# Patient Record
Sex: Female | Born: 1970 | Race: White | Hispanic: No | Marital: Married | Smoking: Never smoker
Health system: Southern US, Community
[De-identification: ages and names within clinical notes are randomized; demographics above are authoritative.]

## PROBLEM LIST (undated history)

## (undated) DIAGNOSIS — F32A Depression, unspecified: Secondary | ICD-10-CM

## (undated) DIAGNOSIS — F329 Major depressive disorder, single episode, unspecified: Secondary | ICD-10-CM

## (undated) DIAGNOSIS — I10 Essential (primary) hypertension: Secondary | ICD-10-CM

## (undated) HISTORY — PX: CHOLECYSTECTOMY: SHX55

## (undated) HISTORY — PX: GALLBLADDER SURGERY: SHX652

---

## 2005-03-13 ENCOUNTER — Ambulatory Visit: Payer: Self-pay | Admitting: Unknown Physician Specialty

## 2008-01-16 ENCOUNTER — Emergency Department: Payer: Self-pay | Admitting: Emergency Medicine

## 2008-01-21 ENCOUNTER — Emergency Department: Payer: Self-pay | Admitting: Internal Medicine

## 2008-01-24 ENCOUNTER — Emergency Department: Payer: Self-pay | Admitting: Internal Medicine

## 2012-07-29 ENCOUNTER — Ambulatory Visit: Payer: Self-pay | Admitting: Unknown Physician Specialty

## 2014-05-15 ENCOUNTER — Ambulatory Visit: Payer: Self-pay

## 2015-10-23 ENCOUNTER — Ambulatory Visit
Admission: EM | Admit: 2015-10-23 | Discharge: 2015-10-23 | Disposition: A | Discharge status: Home / Self Care | Payer: PRIVATE HEALTH INSURANCE | Attending: Family Medicine | Admitting: Family Medicine

## 2015-10-23 DIAGNOSIS — N390 Urinary tract infection, site not specified: Secondary | ICD-10-CM

## 2015-10-23 HISTORY — DX: Depression, unspecified: F32.A

## 2015-10-23 HISTORY — DX: Major depressive disorder, single episode, unspecified: F32.9

## 2015-10-23 LAB — URINALYSIS COMPLETE WITH MICROSCOPIC (ARMC ONLY)
Bilirubin Urine: NEGATIVE
Glucose, UA: NEGATIVE mg/dL
Ketones, ur: NEGATIVE mg/dL
Nitrite: NEGATIVE
PROTEIN: NEGATIVE mg/dL
RBC / HPF: NONE SEEN RBC/hpf (ref 0–5)
SPECIFIC GRAVITY, URINE: 1.01 (ref 1.005–1.030)
pH: 7.5 (ref 5.0–8.0)

## 2015-10-23 MED ORDER — PHENAZOPYRIDINE HCL 200 MG PO TABS: 200.0000 mg | ORAL_TABLET | Freq: Three times a day (TID) | ORAL | Status: AC

## 2015-10-23 MED ORDER — CEPHALEXIN 500 MG PO CAPS: 500.0000 mg | ORAL_CAPSULE | Freq: Two times a day (BID) | ORAL | Status: DC

## 2015-10-23 NOTE — ED Provider Notes (Signed)
CSN: [id number]     Arrival date & time 10/23/15  0845 History   First MD Initiated Contact with Patient 10/23/15 0945     Chief Complaint  Patient presents with  . Urinary Frequency    Burning and frequency, pressure with urination and worsening. Tried AZO. Reports some blood in urine. Pain 5/10   (Consider location/radiation/quality/duration/timing/severity/associated sxs/prior Treatment) HPI  This 45 year old female who presents with burning and frequency with pressure with urination as well as  insatiety which has been worsening. She has had no fever and no chills. Denies any nausea or vomiting. She's been using over-the-counter preparations which have not been beneficial. She's had previous UTIs in the past mostly in childhood and her last one as an adult was several years ago. She denies any vaginal discharge. She has noticed a small amount of blood in her urine at times she has an IUD in place and states that occasionally she will spot   Past Medical History  Diagnosis Date  . Depression    Past Surgical History  Procedure Laterality Date  . Gallbladder surgery     Family History  Problem Relation Age of Onset  . CAD Mother   . Diabetes Mother   . Alcoholism Father    Social History  Substance Use Topics  . Smoking status: Never Smoker   . Smokeless tobacco: None  . Alcohol Use: Yes     Comment: social   OB History    No data available     Review of Systems  Constitutional: Positive for activity change. Negative for fever, chills and fatigue.  Genitourinary: Positive for dysuria, urgency, frequency, hematuria and difficulty urinating.  All other systems reviewed and are negative.   Allergies  Review of patient's allergies indicates no known allergies.  Home Medications   Prior to Admission medications   Medication Sig Start Date End Date Taking? Authorizing Provider  sertraline (ZOLOFT) 25 MG tablet Take 25 mg by mouth daily.   Yes Historical Provider, MD   cephALEXin (KEFLEX) 500 MG capsule Take 1 capsule (500 mg total) by mouth 2 (two) times daily. 10/23/15   Lutricia FeilWilliam P Duward Allbritton, PA-C  phenazopyridine (PYRIDIUM) 200 MG tablet Take 1 tablet (200 mg total) by mouth 3 (three) times daily. 10/23/15   Lutricia FeilWilliam P Jona Zappone, PA-C   Meds Ordered and Administered this Visit  Medications - No data to display  BP 144/79 mmHg  Pulse 74  Temp(Src) 97.5 F (36.4 C) (Oral)  Resp 18  Ht 5\' 4"  (1.626 m)  Wt 164 lb (74.39 kg)  BMI 28.14 kg/m2  SpO2 100% No data found.   Physical Exam  Constitutional: She is oriented to person, place, and time. She appears well-developed and well-nourished. No distress.  HENT:  Head: Normocephalic and atraumatic.  Eyes: Conjunctivae are normal. Pupils are equal, round, and reactive to light.  Neck: Normal range of motion. Neck supple.  Pulmonary/Chest: Effort normal and breath sounds normal. No respiratory distress. She has no wheezes. She has no rales.  Abdominal: Soft. Bowel sounds are normal. She exhibits no distension. There is no tenderness. There is no rebound and no guarding.  Musculoskeletal: Normal range of motion. She exhibits no edema or tenderness.  Neurological: She is alert and oriented to person, place, and time.  Skin: Skin is warm and dry. She is not diaphoretic.  Psychiatric: She has a normal mood and affect. Her behavior is normal. Judgment and thought content normal.  Nursing note and vitals reviewed.  ED Course  Procedures (including critical care time)  Labs Review Labs Reviewed  URINALYSIS COMPLETEWITH MICROSCOPIC ([facility] ONLY) - Abnormal; Notable for the following:    Color, Urine STRAW (*)    Hgb urine dipstick TRACE (*)    Leukocytes, UA TRACE (*)    Bacteria, UA FEW (*)    Squamous Epithelial / LPF 0-5 (*)    All other components within normal limits  URINE CULTURE    Imaging Review No results found.   Visual Acuity Review  Right Eye Distance:   Left Eye Distance:   Bilateral  Distance:    Right Eye Near:   Left Eye Near:    Bilateral Near:         MDM   1. UTI (lower urinary tract infection)    Discharge Medication List as of 10/23/2015 10:04 AM    START taking these medications   Details  cephALEXin (KEFLEX) 500 MG capsule Take 1 capsule (500 mg total) by mouth 2 (two) times daily., Starting 10/23/2015, Until Discontinued, Normal    phenazopyridine (PYRIDIUM) 200 MG tablet Take 1 tablet (200 mg total) by mouth 3 (three) times daily., Starting 10/23/2015, Until Discontinued, Normal      Plan: 1. Test/x-ray results and diagnosis reviewed with patient 2. rx as per orders; risks, benefits, potential side effects reviewed with patient 3. Recommend supportive treatment with Increasing fluids. She'll call in 48 hours for results of CNS.  Lutricia Feil, PA-C 10/23/15 1018

## 2015-10-23 NOTE — Discharge Instructions (Signed)
Dysuria °Dysuria is pain or discomfort while urinating. The pain or discomfort may be felt in the tube that carries urine out of the bladder (urethra) or in the surrounding tissue of the genitals. The pain may also be felt in the groin area, lower abdomen, and lower back. You may have to urinate frequently or have the sudden feeling that you have to urinate (urgency). Dysuria can affect both men and women, but is more common in women. °Dysuria can be caused by many different things, including: °· Urinary tract infection in women. °· Infection of the kidney or bladder. °· Kidney stones or bladder stones. °· Certain sexually transmitted infections (STIs), such as chlamydia. °· Dehydration. °· Inflammation of the vagina. °· Use of certain medicines. °· Use of certain soaps or scented products that cause irritation. °HOME CARE INSTRUCTIONS °Watch your dysuria for any changes. The following actions may help to reduce any discomfort you are feeling: °· Drink enough fluid to keep your urine clear or pale yellow. °· Empty your bladder often. Avoid holding urine for long periods of time. °· After a bowel movement or urination, women should cleanse from front to back, using each tissue only once. °· Empty your bladder after sexual intercourse. °· Take medicines only as directed by your health care provider. °· If you were prescribed an antibiotic medicine, finish it all even if you start to feel better. °· Avoid caffeine, tea, and alcohol. They can irritate the bladder and make dysuria worse. In men, alcohol may irritate the prostate. °· Keep all follow-up visits as directed by your health care provider. This is important. °· If you had any tests done to find the cause of dysuria, it is your responsibility to obtain your test results. Ask the lab or department performing the test when and how you will get your results. Talk with your health care provider if you have any questions about your results. °SEEK MEDICAL CARE  IF: °· You develop pain in your back or sides. °· You have a fever. °· You have nausea or vomiting. °· You have blood in your urine. °· You are not urinating as often as you usually do. °SEEK IMMEDIATE MEDICAL CARE IF: °· You pain is severe and not relieved with medicines. °· You are unable to hold down any fluids. °· You or someone else notices a change in your mental function. °· You have a rapid heartbeat at rest. °· You have shaking or chills. °· You feel extremely weak. °  °This information is not intended to replace advice given to you by your health care provider. Make sure you discuss any questions you have with your health care provider. °  °Document Released: 03/21/2004 Document Revised: 07/14/2014 Document Reviewed: 02/16/2014 °Elsevier Interactive Patient Education ©2016 [employer] ° °Urinary Tract Infection °Urinary tract infections (UTIs) can develop anywhere along your urinary tract. Your urinary tract is your body's drainage system for removing wastes and extra water. Your urinary tract includes two kidneys, two ureters, a bladder, and a urethra. Your kidneys are a pair of bean-shaped organs. Each kidney is about the size of your fist. They are located below your ribs, one on each side of your spine. °CAUSES °Infections are caused by microbes, which are microscopic organisms, including fungi, viruses, and bacteria. These organisms are so small that they can only be seen through a microscope. Bacteria are the microbes that most commonly cause UTIs. °SYMPTOMS  °Symptoms of UTIs may vary by age and gender of the patient   and by the location of the infection. Symptoms in young women typically include a frequent and intense urge to urinate and a painful, burning feeling in the bladder or urethra during urination. Older women and men are more likely to be tired, shaky, and weak and have muscle aches and abdominal pain. A fever may mean the infection is in your kidneys. Other symptoms of a kidney  infection include pain in your back or sides below the ribs, nausea, and vomiting. °DIAGNOSIS °To diagnose a UTI, your caregiver will ask you about your symptoms. Your caregiver will also ask you to provide a urine sample. The urine sample will be tested for bacteria and white blood cells. White blood cells are made by your body to help fight infection. °TREATMENT  °Typically, UTIs can be treated with medication. Because most UTIs are caused by a bacterial infection, they usually can be treated with the use of antibiotics. The choice of antibiotic and length of treatment depend on your symptoms and the type of bacteria causing your infection. °HOME CARE INSTRUCTIONS °· If you were prescribed antibiotics, take them exactly as your caregiver instructs you. Finish the medication even if you feel better after you have only taken some of the medication. °· Drink enough water and fluids to keep your urine clear or pale yellow. °· Avoid caffeine, tea, and carbonated beverages. They tend to irritate your bladder. °· Empty your bladder often. Avoid holding urine for long periods of time. °· Empty your bladder before and after sexual intercourse. °· After a bowel movement, women should cleanse from front to back. Use each tissue only once. °SEEK MEDICAL CARE IF:  °· You have back pain. °· You develop a fever. °· Your symptoms do not begin to resolve within 3 days. °SEEK IMMEDIATE MEDICAL CARE IF:  °· You have severe back pain or lower abdominal pain. °· You develop chills. °· You have nausea or vomiting. °· You have continued burning or discomfort with urination. °MAKE SURE YOU:  °· Understand these instructions. °· Will watch your condition. °· Will get help right away if you are not doing well or get worse. °  °This information is not intended to replace advice given to you by your health care provider. Make sure you discuss any questions you have with your health care provider. °  °Document Released: 04/02/2005 Document  Revised: 03/14/2015 Document Reviewed: 08/01/2011 °Elsevier Interactive Patient Education ©2016 Elsevier Inc. ° °

## 2015-10-25 LAB — URINE CULTURE: Special Requests: NORMAL

## 2017-06-08 ENCOUNTER — Encounter: Payer: Self-pay | Admitting: Emergency Medicine

## 2017-06-08 ENCOUNTER — Emergency Department: Payer: PRIVATE HEALTH INSURANCE

## 2017-06-08 ENCOUNTER — Emergency Department
Admission: EM | Admit: 2017-06-08 | Discharge: 2017-06-08 | Disposition: A | Discharge status: Home / Self Care | Payer: PRIVATE HEALTH INSURANCE | Attending: Emergency Medicine | Admitting: Emergency Medicine

## 2017-06-08 ENCOUNTER — Other Ambulatory Visit: Payer: Self-pay

## 2017-06-08 DIAGNOSIS — R42 Dizziness and giddiness: Secondary | ICD-10-CM | POA: Insufficient documentation

## 2017-06-08 DIAGNOSIS — M79602 Pain in left arm: Secondary | ICD-10-CM | POA: Insufficient documentation

## 2017-06-08 DIAGNOSIS — R110 Nausea: Secondary | ICD-10-CM | POA: Insufficient documentation

## 2017-06-08 DIAGNOSIS — R079 Chest pain, unspecified: Secondary | ICD-10-CM

## 2017-06-08 DIAGNOSIS — M549 Dorsalgia, unspecified: Secondary | ICD-10-CM | POA: Insufficient documentation

## 2017-06-08 DIAGNOSIS — R0789 Other chest pain: Secondary | ICD-10-CM | POA: Insufficient documentation

## 2017-06-08 HISTORY — DX: Essential (primary) hypertension: I10

## 2017-06-08 LAB — CBC
HEMATOCRIT: 43.7 % (ref 35.0–47.0)
HEMOGLOBIN: 14.7 g/dL (ref 12.0–16.0)
MCH: 31.1 pg (ref 26.0–34.0)
MCHC: 33.7 g/dL (ref 32.0–36.0)
MCV: 92.4 fL (ref 80.0–100.0)
Platelets: 206 10*3/uL (ref 150–440)
RBC: 4.74 MIL/uL (ref 3.80–5.20)
RDW: 13 % (ref 11.5–14.5)
WBC: 4.8 10*3/uL (ref 3.6–11.0)

## 2017-06-08 LAB — TROPONIN I
Troponin I: <0.03 ng/mL (ref <0.03)
Troponin I: <0.03 ng/mL (ref <0.03)

## 2017-06-08 LAB — BASIC METABOLIC PANEL
ANION GAP: 10 (ref 5–15)
BUN: 12 mg/dL (ref 6–20)
CALCIUM: 9.7 mg/dL (ref 8.9–10.3)
CHLORIDE: 104 mmol/L (ref 101–111)
CO2: 25 mmol/L (ref 22–32)
Creatinine, Ser: 0.64 mg/dL (ref 0.44–1.00)
GFR calc Af Amer: >60 mL/min (ref >60)
GFR calc non Af Amer: >60 mL/min (ref >60)
GLUCOSE: 155 mg/dL — AB (ref 65–99)
Potassium: 4 mmol/L (ref 3.5–5.1)
Sodium: 139 mmol/L (ref 135–145)

## 2017-06-08 MED ORDER — DIAZEPAM 5 MG PO TABS: 5.0000 mg | ORAL_TABLET | Freq: Three times a day (TID) | ORAL | 0 refills | Status: AC | PRN

## 2017-06-08 MED ORDER — OXYCODONE-ACETAMINOPHEN 5-325 MG PO TABS
2.0000 | ORAL_TABLET | Freq: Once | ORAL | Status: AC
  Administered 2017-06-08: 2 via ORAL
  Filled 2017-06-08: qty 2

## 2017-06-08 MED ORDER — IBUPROFEN 800 MG PO TABS: 800.0000 mg | ORAL_TABLET | Freq: Three times a day (TID) | ORAL | 0 refills | Status: AC | PRN

## 2017-06-08 MED ORDER — IOPAMIDOL (ISOVUE-370) INJECTION 76%
75.0000 mL | Freq: Once | INTRAVENOUS | Status: AC | PRN
  Administered 2017-06-08: 75 mL via INTRAVENOUS

## 2017-06-08 NOTE — ED Notes (Signed)
Patient transported to CT 

## 2017-06-08 NOTE — ED Triage Notes (Signed)
Pt reports left side chest pain that began approximately one week ago. Pt states the pain radiates to her left breast which feels painful and tingling. Pt reports associated back pain for three weeks, left arm pain, SOB at night, lightheadedness and nausea. Pt ambulatory to triage. No apparent distress noted.

## 2017-06-08 NOTE — ED Provider Notes (Signed)
[facility] Emergency Department Provider Note       Time seen: ----------------------------------------- 10:54 AM on 06/08/2017 -----------------------------------------   I have reviewed the triage vital signs and the nursing notes.  HISTORY   Chief Complaint Chest Pain and Breast Pain    HPI Shannon Esparza is a 46 y.o. female with a history of depression who presents to the ED for left-sided chest pain that began approximately one week ago. The pain has progressively worsened. She states the pain radiates into her left breast describes as painful and tingling. She reports associated back pain for 3 weeks as well as left arm pain and some shortness of breath. She's also had lightheadedness and nausea. Nothing makes the pain better.  Past Medical History:  Diagnosis Date  . Depression     There are no active problems to display for this patient.   Past Surgical History:  Procedure Laterality Date  . GALLBLADDER SURGERY      Allergies Patient has no known allergies.  Social History Social History   Tobacco Use  . Smoking status: Never Smoker  Substance Use Topics  . Alcohol use: Yes    Comment: social  . Drug use: Not on file    Review of Systems Constitutional: Negative for fever. Eyes: Negative for vision changes ENT:  Negative for congestion, sore throat Cardiovascular: Positive for chest pain Respiratory: Negative for shortness of breath. Gastrointestinal: Negative for abdominal pain, vomiting and diarrhea. Musculoskeletal: Positive for left arm and back pain Skin: Negative for rash. Neurological: Negative for headaches, focal weakness or numbness.  All systems negative/normal/unremarkable except as stated in the HPI  ____________________________________________   PHYSICAL EXAM:  VITAL SIGNS: ED Triage Vitals [06/08/17 0921]  Enc Vitals Group     BP (!) 161/75     Pulse Rate 83     Resp 18     Temp 98.6 F (37 C)      Temp Source Oral     SpO2 100 %     Weight 167 lb (75.8 kg)     Height 5\' 4"  (1.626 m)     Head Circumference      Peak Flow      Pain Score 7     Pain Loc      Pain Edu?      Excl. in GC?     Constitutional: Alert and oriented. Well appearing and in no distress. Eyes: Conjunctivae are normal. Normal extraocular movements. ENT   Head: Normocephalic and atraumatic.   Nose: No congestion/rhinnorhea.   Mouth/Throat: Mucous membranes are moist.   Neck: No stridor. Cardiovascular: Normal rate, regular rhythm. No murmurs, rubs, or gallops. Respiratory: Normal respiratory effort without tachypnea nor retractions. Breath sounds are clear and equal bilaterally. No wheezes/rales/rhonchi. Gastrointestinal: Soft and nontender. Normal bowel sounds Musculoskeletal: Nontender with normal range of motion in extremities. No lower extremity tenderness nor edema. Reproducible left chest wall tenderness and tenderness around the left breast Neurologic:  Normal speech and language. No gross focal neurologic deficits are appreciated.  Skin:  Skin is warm, dry and intact. No rash noted. Psychiatric: Mood and affect are normal. Speech and behavior are normal.  ____________________________________________  EKG: Interpreted by me. Sinus rhythm rate 81 bpm, normal PR interval, normal QRS, normal QT.  ____________________________________________  ED COURSE:  Pertinent labs & imaging results that were available during my care of the patient were reviewed by me and considered in my medical decision making (see chart for details). Patient  presents for chest pain, we will assess with labs and imaging as indicated.   Procedures ____________________________________________   LABS (pertinent positives/negatives)  Labs Reviewed  BASIC METABOLIC PANEL - Abnormal; Notable for the following components:      Result Value   Glucose, Bld 155 (*)    All other components within normal limits  CBC   TROPONIN I  TROPONIN I    RADIOLOGY Images were viewed by me  Chest x-ray is normal CT angiogram is negative ____________________________________________  DIFFERENTIAL DIAGNOSIS   Musculoskeletal pain, unstable angina, MI, PE, pneumothorax, shingles  FINAL ASSESSMENT AND PLAN  Chest pain   Plan: Patient had presented for chest pain which is likely musculoskeletal in origin. Patient's labs were negative including repeat troponin. Patient's imaging was reassuring including negative CT of the chest. She'll be discharged with Motrin and Valium and is stable for outpatient follow-up.   Emily FilbertWilliams, Jonathan E, MD   Note: This note was generated in part or whole with voice recognition software. Voice recognition is usually quite accurate but there are transcription errors that can and very often do occur. I apologize for any typographical errors that were not detected and corrected.     Emily FilbertWilliams, Jonathan E, MD 06/08/17 [phone]

## 2017-06-08 NOTE — ED Notes (Signed)
First Nurse: pt presents with chest pain started last night. Pt in bathroom when called for triage.

## 2017-06-10 ENCOUNTER — Other Ambulatory Visit: Payer: Self-pay | Admitting: Family Medicine

## 2017-06-10 DIAGNOSIS — R928 Other abnormal and inconclusive findings on diagnostic imaging of breast: Secondary | ICD-10-CM

## 2017-06-11 ENCOUNTER — Other Ambulatory Visit: Payer: Self-pay | Admitting: Family Medicine

## 2017-06-11 DIAGNOSIS — R928 Other abnormal and inconclusive findings on diagnostic imaging of breast: Secondary | ICD-10-CM

## 2017-06-19 ENCOUNTER — Ambulatory Visit
Admission: RE | Admit: 2017-06-19 | Discharge: 2017-06-19 | Disposition: A | Discharge status: Home / Self Care | Payer: Self-pay | Source: Ambulatory Visit | Attending: Family Medicine | Admitting: Family Medicine

## 2017-06-19 DIAGNOSIS — R928 Other abnormal and inconclusive findings on diagnostic imaging of breast: Secondary | ICD-10-CM

## 2017-07-22 ENCOUNTER — Ambulatory Visit: Payer: PRIVATE HEALTH INSURANCE

## 2018-06-15 ENCOUNTER — Other Ambulatory Visit: Payer: Self-pay | Admitting: Family Medicine

## 2018-06-15 DIAGNOSIS — Z1231 Encounter for screening mammogram for malignant neoplasm of breast: Secondary | ICD-10-CM

## 2018-06-22 ENCOUNTER — Encounter (INDEPENDENT_AMBULATORY_CARE_PROVIDER_SITE_OTHER): Payer: Self-pay

## 2018-06-22 ENCOUNTER — Ambulatory Visit
Admission: RE | Admit: 2018-06-22 | Discharge: 2018-06-22 | Disposition: A | Discharge status: Home / Self Care | Payer: Self-pay | Source: Ambulatory Visit | Attending: Family Medicine | Admitting: Family Medicine

## 2018-06-22 DIAGNOSIS — Z1231 Encounter for screening mammogram for malignant neoplasm of breast: Secondary | ICD-10-CM | POA: Insufficient documentation

## 2018-07-30 ENCOUNTER — Other Ambulatory Visit: Payer: Self-pay

## 2018-07-30 ENCOUNTER — Ambulatory Visit
Admission: EM | Admit: 2018-07-30 | Discharge: 2018-07-30 | Disposition: A | Discharge status: Home / Self Care | Payer: Self-pay | Attending: Family Medicine | Admitting: Family Medicine

## 2018-07-30 ENCOUNTER — Encounter: Payer: Self-pay | Admitting: Emergency Medicine

## 2018-07-30 DIAGNOSIS — R300 Dysuria: Secondary | ICD-10-CM

## 2018-07-30 LAB — URINALYSIS, COMPLETE (UACMP) WITH MICROSCOPIC
Bacteria, UA: NONE SEEN
Bilirubin Urine: NEGATIVE
Glucose, UA: NEGATIVE mg/dL
Ketones, ur: NEGATIVE mg/dL
Nitrite: NEGATIVE
PROTEIN: NEGATIVE mg/dL
SPECIFIC GRAVITY, URINE: 1.01 (ref 1.005–1.030)
pH: 6.5 (ref 5.0–8.0)

## 2018-07-30 MED ORDER — CEPHALEXIN 500 MG PO CAPS: 500.0000 mg | ORAL_CAPSULE | Freq: Two times a day (BID) | ORAL | 0 refills | Status: AC

## 2018-07-30 NOTE — ED Triage Notes (Signed)
Pt c/o dysuria, pelvic pain, headaches, nausea, urinary urgency and frequency,  Started about a week ago.

## 2018-07-30 NOTE — ED Provider Notes (Signed)
[facility]    CSN: [id number] Arrival date & time: 07/30/18  1740     History   Chief Complaint Chief Complaint  Patient presents with  . Dysuria  . Pelvic Pain    HPI Shannon Esparza is a 48 y.o. female.   The history is provided by the patient.  Dysuria  Pain quality:  Burning Pain severity:  Mild Onset quality:  Sudden Duration:  1 week Timing:  Constant Progression:  Unable to specify Chronicity:  New Recent urinary tract infections: no   Relieved by:  Phenazopyridine Urinary symptoms: frequent urination   Urinary symptoms: no discolored urine, no foul-smelling urine, no hematuria, no hesitancy and no bladder incontinence   Associated symptoms: no abdominal pain, no fever, no flank pain, no genital lesions, no nausea, no vaginal discharge and no vomiting   Associated symptoms comment:  Suprapubic pressure Risk factors: no hx of pyelonephritis, no hx of urolithiasis, no kidney transplant, not pregnant, no single kidney and no urinary catheter   Pelvic Pain  Pertinent negatives include no abdominal pain.    Past Medical History:  Diagnosis Date  . Depression   . Hypertension     There are no active problems to display for this patient.   Past Surgical History:  Procedure Laterality Date  . CHOLECYSTECTOMY    . GALLBLADDER SURGERY      OB History   No obstetric history on file.      Home Medications    Prior to Admission medications   Medication Sig Start Date End Date Taking? Authorizing Provider  cephALEXin (KEFLEX) 500 MG capsule Take 1 capsule (500 mg total) by mouth 2 (two) times daily. 07/30/18   Payton Mccallum, MD  diazepam (VALIUM) 5 MG tablet Take 1 tablet (5 mg total) by mouth every 8 (eight) hours as needed for muscle spasms. 06/08/17   Emily Filbert, MD  ibuprofen (ADVIL,MOTRIN) 800 MG tablet Take 1 tablet (800 mg total) by mouth every 8 (eight) hours as needed. 06/08/17   Emily Filbert, MD  phenazopyridine  (PYRIDIUM) 200 MG tablet Take 1 tablet (200 mg total) by mouth 3 (three) times daily. 10/23/15   Lutricia Feil, PA-C  sertraline (ZOLOFT) 25 MG tablet Take 25 mg by mouth daily.    [provider]    Family History Family History  Problem Relation Age of Onset  . CAD Mother   . Diabetes Mother   . Alcoholism Father   . Breast cancer Neg Hx     Social History Social History   Tobacco Use  . Smoking status: Never Smoker  . Smokeless tobacco: Never Used  Substance Use Topics  . Alcohol use: Yes    Comment: social  . Drug use: Never     Allergies   Patient has no known allergies.   Review of Systems Review of Systems  Constitutional: Negative for fever.  Gastrointestinal: Negative for abdominal pain, nausea and vomiting.  Genitourinary: Positive for dysuria and pelvic pain. Negative for flank pain and vaginal discharge.     Physical Exam Triage Vital Signs ED Triage Vitals  Enc Vitals Group     BP 07/30/18 1825 (!) 160/79     Pulse Rate 07/30/18 1825 70     Resp 07/30/18 1825 18     Temp 07/30/18 1825 98.4 F (36.9 C)     Temp Source 07/30/18 1825 Oral     SpO2 07/30/18 1825 100 %     Weight 07/30/18  1822 158 lb (71.7 kg)     Height 07/30/18 1822 5\' 4"  (1.626 m)     Head Circumference --      Peak Flow --      Pain Score 07/30/18 1822 6     Pain Loc --      Pain Edu? --      Excl. in GC? --    No data found.  Updated Vital Signs BP (!) 160/79 (BP Location: Left Arm)   Pulse 70   Temp 98.4 F (36.9 C) (Oral)   Resp 18   Ht 5\' 4"  (1.626 m)   Wt 71.7 kg   SpO2 100%   BMI 27.12 kg/m   Visual Acuity Right Eye Distance:   Left Eye Distance:   Bilateral Distance:    Right Eye Near:   Left Eye Near:    Bilateral Near:     Physical Exam Vitals signs and nursing note reviewed.  Constitutional:      General: She is not in acute distress.    Appearance: She is well-developed. She is not diaphoretic.  Abdominal:     General: Bowel  sounds are normal. There is no distension.     Palpations: Abdomen is soft. There is no mass.     Tenderness: There is abdominal tenderness. There is no guarding or rebound.      UC Treatments / Results  Labs (all labs ordered are listed, but only abnormal results are displayed) Labs Reviewed  URINALYSIS, COMPLETE (UACMP) WITH MICROSCOPIC - Abnormal; Notable for the following components:      Result Value   Hgb urine dipstick TRACE (*)    Leukocytes, UA TRACE (*)    All other components within normal limits  URINE CULTURE    EKG None  Radiology No results found.  Procedures Procedures (including critical care time)  Medications Ordered in UC Medications - No data to display  Initial Impression / Assessment and Plan / UC Course  I have reviewed the triage vital signs and the nursing notes.  Pertinent labs & imaging results that were available during my care of the patient were reviewed by me and considered in my medical decision making (see chart for details).      Final Clinical Impressions(s) / UC Diagnoses   Final diagnoses:  Dysuria    ED Prescriptions    Medication Sig Dispense Auth. Provider   cephALEXin (KEFLEX) 500 MG capsule Take 1 capsule (500 mg total) by mouth 2 (two) times daily. 10 capsule Payton Mccallum, MD      1. Labresults and diagnosis reviewed with patient 2. rx as per orders above; reviewed possible side effects, interactions, risks and benefits  3. Recommend supportive treatment with increase water intake 4. Check urine culture  5. Follow-up prn if symptoms worsen or don't improve  Controlled Substance Prescriptions [place] Controlled Substance Registry consulted? Not Applicable   Payton Mccallum, MD 07/30/18 [phone]

## 2018-08-01 LAB — URINE CULTURE: SPECIAL REQUESTS: NORMAL

## 2019-01-15 IMAGING — CT CT ANGIO CHEST
2 of 6 series · 18 of 46 positions shown · IV contrast (APPLIED)
Comparison: Chest radiograph June 08, 2017

CLINICAL DATA: Chest pain and shortness of breath

EXAM:
CT ANGIOGRAPHY CHEST WITH CONTRAST
TECHNIQUE: Multidetector CT imaging of the chest was performed using the
standard protocol during bolus administration of intravenous
contrast. Multiplanar CT image reconstructions and MIPs were
obtained to evaluate the vascular anatomy.
CONTRAST:  75mL ZD9GTA-AC3 IOPAMIDOL (ZD9GTA-AC3) INJECTION 76%

[Series 5: thins · axial · 0.65mm/px · z∈[-295,-53]mm · 16 of 266 slices shown]
[im 12/266  lung]
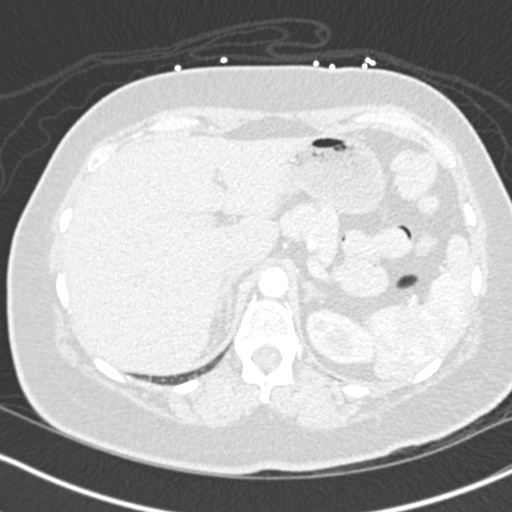
[im 35/266  soft-tissue]
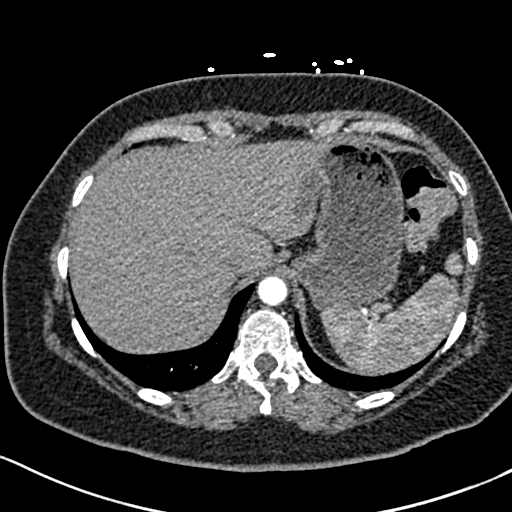
[im 47/266  lung]
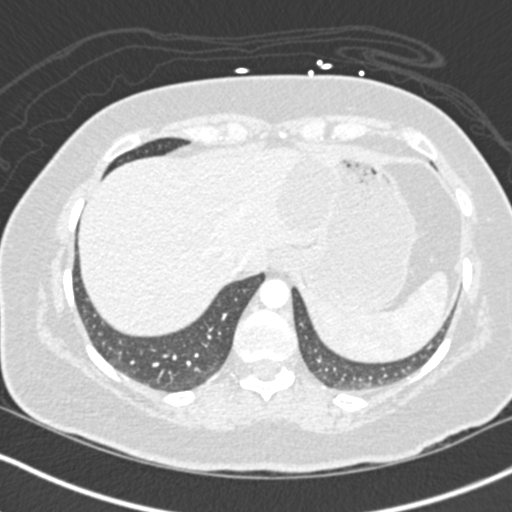
[im 58/266  soft-tissue]
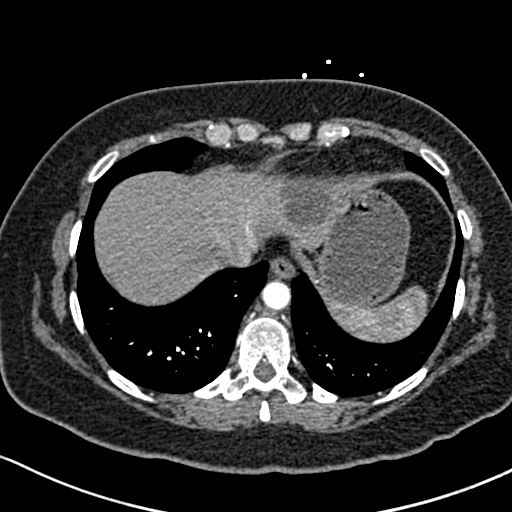
[im 81/266  lung]
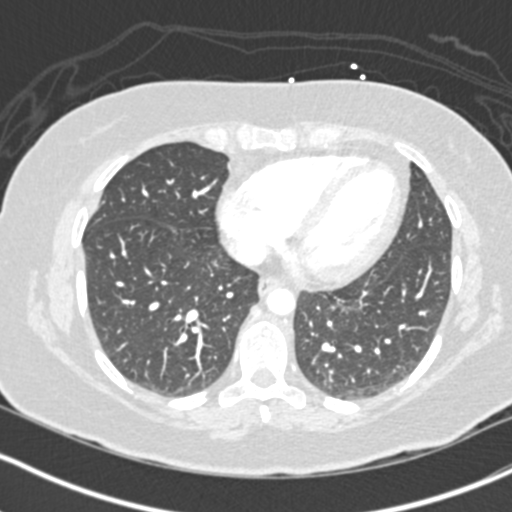
[im 93/266  soft-tissue]
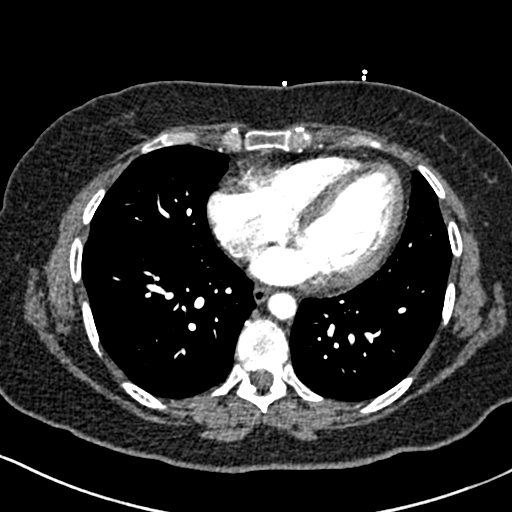
[im 104/266  lung]
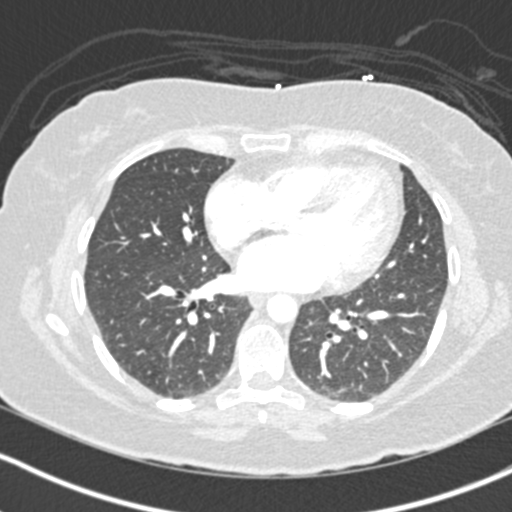
[im 127/266  soft-tissue]
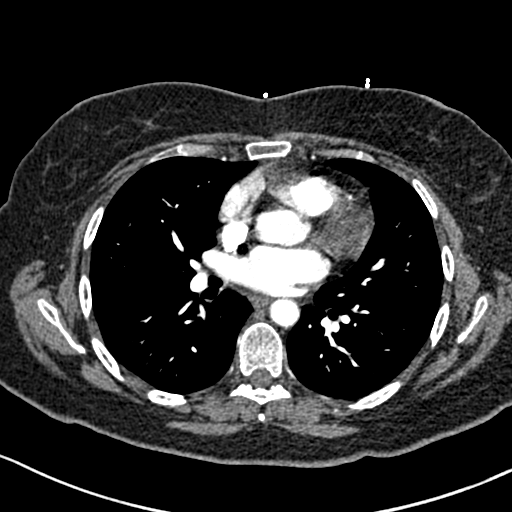
[im 139/266  lung]
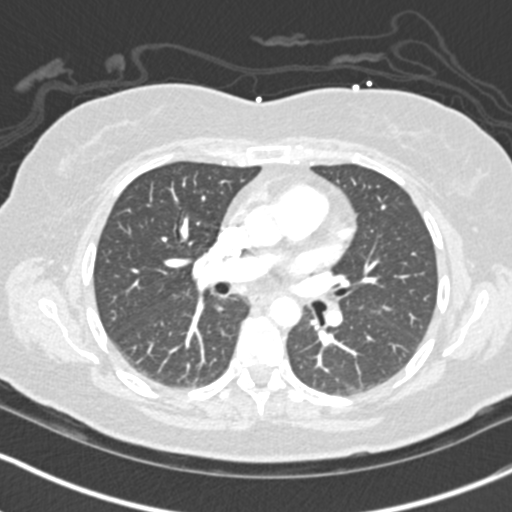
[im 162/266  soft-tissue]
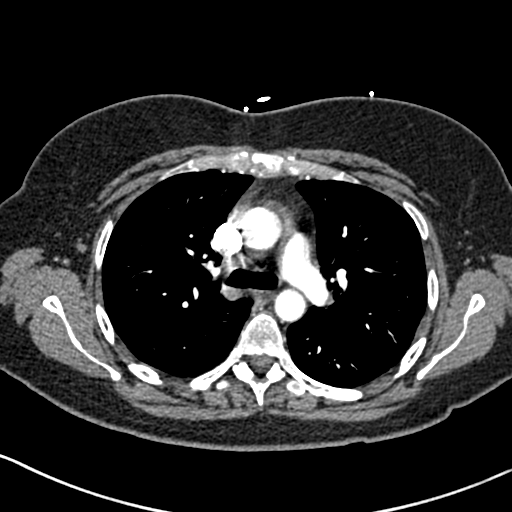
[im 173/266  lung]
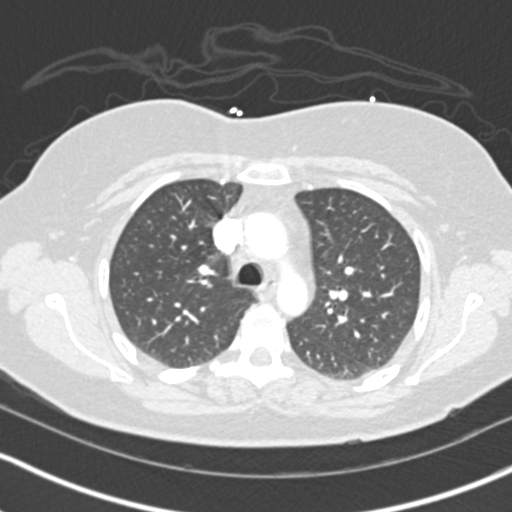
[im 185/266  soft-tissue]
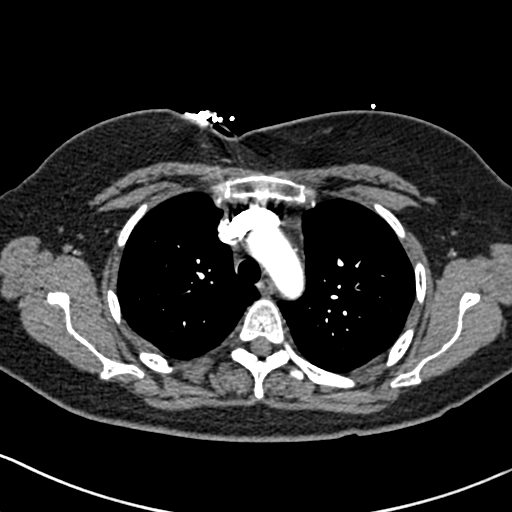
[im 208/266  lung]
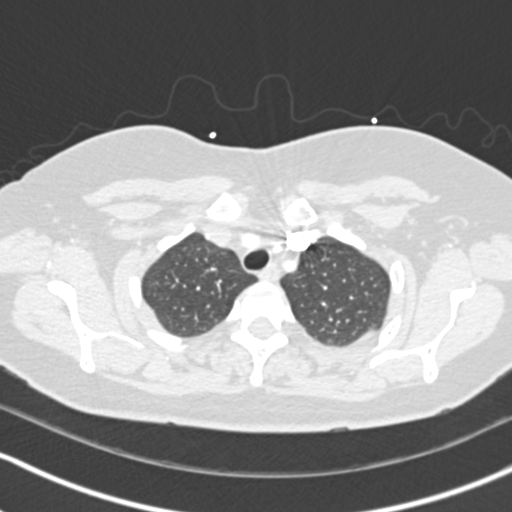
[im 219/266  soft-tissue]
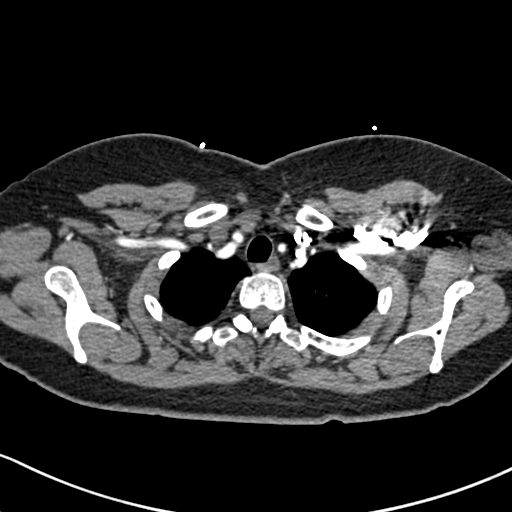
[im 231/266  lung]
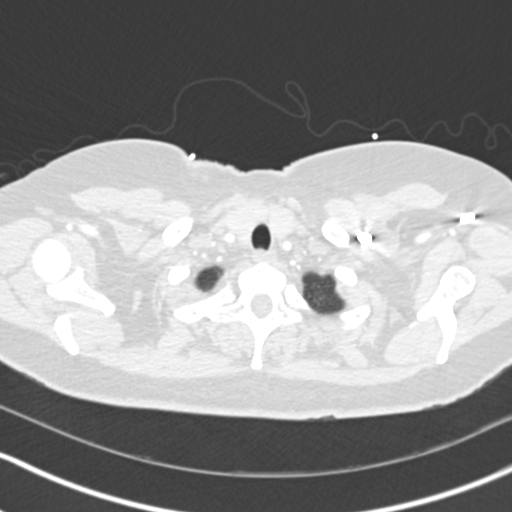
[im 254/266  soft-tissue]
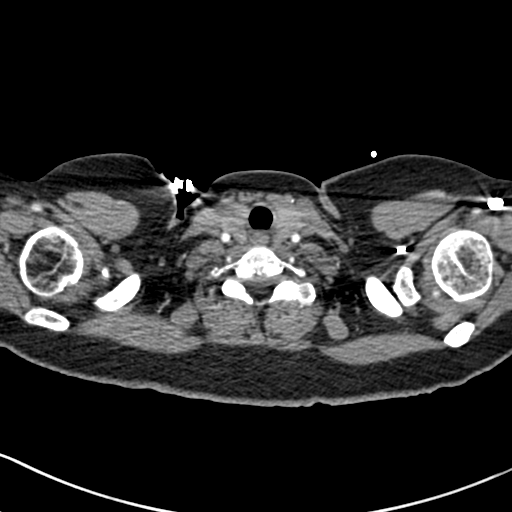

[Series 7: coronal mpr · coronal · 0.52mm/px · 2 of 82 slices shown]
[im 28/82  soft-tissue]
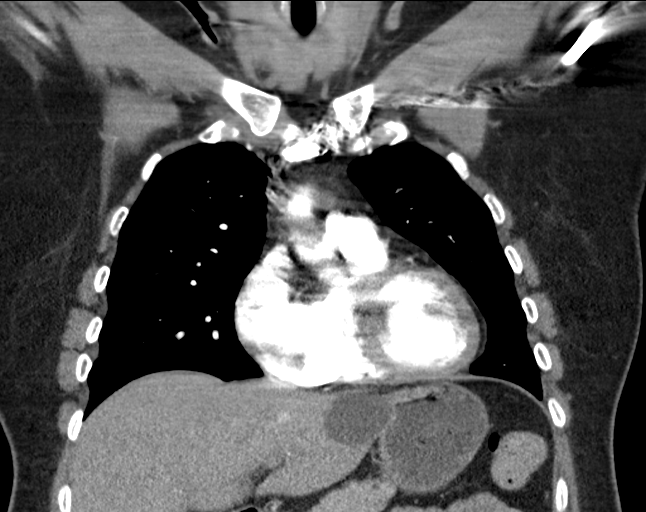
[im 55/82  soft-tissue]
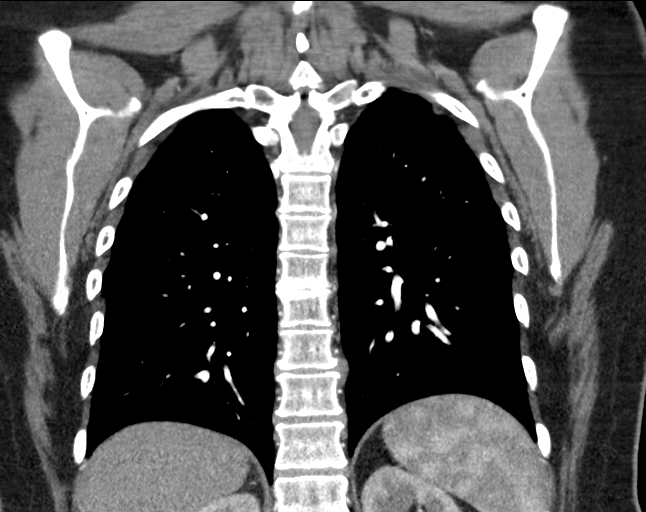

[18 of 46 positions shown; findings below may reference images not displayed]

FINDINGS: Cardiovascular: There is no demonstrable pulmonary embolus. There is
no appreciable thoracic aortic aneurysm or dissection. Visualized
great vessels appear normal. There is no appreciable pericardial
effusion or thickening.

Mediastinum/Nodes: Visualized thyroid appears unremarkable. There is
no appreciable thoracic adenopathy. No esophageal lesions are
appreciable.

Lungs/Pleura: There is lower lobe atelectatic change bilaterally.
Lungs elsewhere are clear. No pleural effusion or pleural thickening
evident. No pneumothorax.

Upper Abdomen: There is a cyst in the lateral segment of the left
lobe of the liver measuring 4.7 x 3.6 cm. Visualized upper abdominal
structures otherwise appear unremarkable.

Musculoskeletal: There is degenerative change in the lower thoracic
spine. There are no blastic or lytic bone lesions.

Review of the MIP images confirms the above findings.
IMPRESSION: 1.  No demonstrable pulmonary embolus.

2.  No edema or consolidation.  Mild bibasilar atelectasis.

3.  No adenopathy evident.

4.  Prominent cyst left lobe of liver, benign in appearance.
